# Patient Record
Sex: Male | Born: 1965 | Race: White | Hispanic: No | Marital: Married
Health system: Southern US, Community
[De-identification: ages and names within clinical notes are randomized; demographics above are authoritative.]

---

## 2016-11-28 ENCOUNTER — Other Ambulatory Visit: Payer: Self-pay | Admitting: Neurosurgery

## 2016-11-28 DIAGNOSIS — I6509 Occlusion and stenosis of unspecified vertebral artery: Secondary | ICD-10-CM

## 2016-12-27 ENCOUNTER — Other Ambulatory Visit: Payer: Self-pay | Admitting: Neurosurgery

## 2016-12-27 ENCOUNTER — Ambulatory Visit (HOSPITAL_COMMUNITY)
Admission: RE | Admit: 2016-12-27 | Discharge: 2016-12-27 | Disposition: A | Discharge status: Home / Self Care | Payer: BLUE CROSS/BLUE SHIELD | Source: Ambulatory Visit | Attending: Neurosurgery | Admitting: Neurosurgery

## 2016-12-27 ENCOUNTER — Encounter (HOSPITAL_COMMUNITY): Payer: Self-pay | Admitting: Neurosurgery

## 2016-12-27 DIAGNOSIS — I6509 Occlusion and stenosis of unspecified vertebral artery: Secondary | ICD-10-CM

## 2016-12-27 DIAGNOSIS — R42 Dizziness and giddiness: Secondary | ICD-10-CM | POA: Diagnosis present

## 2016-12-27 DIAGNOSIS — M545 Low back pain: Secondary | ICD-10-CM | POA: Diagnosis not present

## 2016-12-27 DIAGNOSIS — R55 Syncope and collapse: Secondary | ICD-10-CM | POA: Diagnosis not present

## 2016-12-27 DIAGNOSIS — G8929 Other chronic pain: Secondary | ICD-10-CM | POA: Diagnosis not present

## 2016-12-27 HISTORY — PX: IR ANGIO INTRA EXTRACRAN SEL INTERNAL CAROTID BILAT MOD SED: IMG5363

## 2016-12-27 HISTORY — PX: IR ANGIO VERTEBRAL SEL VERTEBRAL UNI L MOD SED: IMG5367

## 2016-12-27 LAB — CBC WITH DIFFERENTIAL/PLATELET
BASOS PCT: 0 %
Basophils Absolute: 0 10*3/uL (ref 0.0–0.1)
EOS ABS: 0.1 10*3/uL (ref 0.0–0.7)
Eosinophils Relative: 2 %
HCT: 42.6 % (ref 39.0–52.0)
HEMOGLOBIN: 14.5 g/dL (ref 13.0–17.0)
Lymphocytes Relative: 24 %
Lymphs Abs: 1.7 10*3/uL (ref 0.7–4.0)
MCH: 29.2 pg (ref 26.0–34.0)
MCHC: 34 g/dL (ref 30.0–36.0)
MCV: 85.7 fL (ref 78.0–100.0)
MONOS PCT: 7 %
Monocytes Absolute: 0.5 10*3/uL (ref 0.1–1.0)
NEUTROS PCT: 67 %
Neutro Abs: 4.9 10*3/uL (ref 1.7–7.7)
Platelets: 222 10*3/uL (ref 150–400)
RBC: 4.97 MIL/uL (ref 4.22–5.81)
RDW: 12.8 % (ref 11.5–15.5)
WBC: 7.2 10*3/uL (ref 4.0–10.5)

## 2016-12-27 LAB — BASIC METABOLIC PANEL
Anion gap: 6 (ref 5–15)
BUN: 14 mg/dL (ref 6–20)
CALCIUM: 8.8 mg/dL — AB (ref 8.9–10.3)
CO2: 25 mmol/L (ref 22–32)
CREATININE: 0.83 mg/dL (ref 0.61–1.24)
Chloride: 106 mmol/L (ref 101–111)
GFR calc non Af Amer: >60 mL/min (ref >60)
Glucose, Bld: 108 mg/dL — ABNORMAL HIGH (ref 65–99)
Potassium: 4.7 mmol/L (ref 3.5–5.1)
SODIUM: 137 mmol/L (ref 135–145)

## 2016-12-27 LAB — APTT: APTT: 30 s (ref 24–36)

## 2016-12-27 LAB — PROTIME-INR
INR: 1
PROTHROMBIN TIME: 13.2 s (ref 11.4–15.2)

## 2016-12-27 MED ORDER — FENTANYL CITRATE (PF) 100 MCG/2ML IJ SOLN
INTRAMUSCULAR | Status: AC
  Filled 2016-12-27: qty 2

## 2016-12-27 MED ORDER — MIDAZOLAM HCL 2 MG/2ML IJ SOLN
INTRAMUSCULAR | Status: AC | PRN
  Administered 2016-12-27: 1 mg via INTRAVENOUS

## 2016-12-27 MED ORDER — SODIUM CHLORIDE 0.9 % IV SOLN: INTRAVENOUS | Status: DC

## 2016-12-27 MED ORDER — IOPAMIDOL (ISOVUE-300) INJECTION 61%
INTRAVENOUS | Status: AC
  Administered 2016-12-27: 40 mL
  Filled 2016-12-27: qty 150

## 2016-12-27 MED ORDER — LIDOCAINE HCL (PF) 1 % IJ SOLN
INTRAMUSCULAR | Status: AC
  Filled 2016-12-27: qty 30

## 2016-12-27 MED ORDER — HEPARIN SODIUM (PORCINE) 1000 UNIT/ML IJ SOLN
INTRAMUSCULAR | Status: AC
  Filled 2016-12-27: qty 2

## 2016-12-27 MED ORDER — MIDAZOLAM HCL 2 MG/2ML IJ SOLN
INTRAMUSCULAR | Status: AC
  Filled 2016-12-27: qty 2

## 2016-12-27 MED ORDER — CEFAZOLIN SODIUM-DEXTROSE 2-4 GM/100ML-% IV SOLN
2.0000 g | INTRAVENOUS | Status: AC
  Administered 2016-12-27: 2 g via INTRAVENOUS

## 2016-12-27 MED ORDER — SODIUM CHLORIDE 0.9 % IV SOLN
INTRAVENOUS | Status: AC | PRN
  Administered 2016-12-27: 50 mL/h via INTRAVENOUS

## 2016-12-27 MED ORDER — FENTANYL CITRATE (PF) 100 MCG/2ML IJ SOLN
INTRAMUSCULAR | Status: AC | PRN
  Administered 2016-12-27: 25 ug via INTRAVENOUS

## 2016-12-27 MED ORDER — LIDOCAINE HCL (PF) 1 % IJ SOLN
INTRAMUSCULAR | Status: AC | PRN
  Administered 2016-12-27: 10 mL

## 2016-12-27 MED ORDER — HYDROCODONE-ACETAMINOPHEN 5-325 MG PO TABS: 1.0000 | ORAL_TABLET | ORAL | Status: DC | PRN

## 2016-12-27 MED ORDER — HEPARIN SODIUM (PORCINE) 1000 UNIT/ML IJ SOLN
INTRAMUSCULAR | Status: AC | PRN
  Administered 2016-12-27: 2000 [IU] via INTRAVENOUS

## 2016-12-27 NOTE — Sedation Documentation (Signed)
Patient is resting comfortably. 

## 2016-12-27 NOTE — Discharge Instructions (Addendum)
Femoral Site Care °Refer to this sheet in the next few weeks. These instructions provide you with information about caring for yourself after your procedure. Your health care provider may also give you more specific instructions. Your treatment has been planned according to current medical practices, but problems sometimes occur. Call your health care provider if you have any problems or questions after your procedure. °What can I expect after the procedure? °After your procedure, it is typical to have the following: °· Bruising at the site that usually fades within 1-2 weeks. °· Blood collecting in the tissue (hematoma) that may be painful to the touch. It should usually decrease in size and tenderness within 1-2 weeks. ° °Follow these instructions at home: °· Take medicines only as directed by your health care provider. °· You may shower 24-48 hours after the procedure or as directed by your health care provider. Remove the bandage (dressing) and gently wash the site with plain soap and water. Pat the area dry with a clean towel. Do not rub the site, because this may cause bleeding. °· Do not take baths, swim, or use a hot tub until your health care provider approves. °· Check your insertion site every day for redness, swelling, or drainage. °· Do not apply powder or lotion to the site. °· Limit use of stairs to twice a day for the first 2-3 days or as directed by your health care provider. °· Do not squat for the first 2-3 days or as directed by your health care provider. °· Do not lift over 10 lb (4.5 kg) for 5 days after your procedure or as directed by your health care provider. °· Ask your health care provider when it is okay to: °? Return to work or school. °? Resume usual physical activities or sports. °? Resume sexual activity. °· Do not drive home if you are discharged the same day as the procedure. Have someone else drive you. °· You may drive 24 hours after the procedure unless otherwise instructed by  your health care provider. °· Do not operate machinery or power tools for 24 hours after the procedure or as directed by your health care provider. °· If your procedure was done as an outpatient procedure, which means that you went home the same day as your procedure, a responsible adult should be with you for the first 24 hours after you arrive home. °· Keep all follow-up visits as directed by your health care provider. This is important. °Contact a health care provider if: °· You have a fever. °· You have chills. °· You have increased bleeding from the site. Hold pressure on the site. °Get help right away if: °· You have unusual pain at the site. °· You have redness, warmth, or swelling at the site. °· You have drainage (other than a small amount of blood on the dressing) from the site. °· The site is bleeding, and the bleeding does not stop after 30 minutes of holding steady pressure on the site. °· Your leg or foot becomes pale, cool, tingly, or numb. °This information is not intended to replace advice given to you by your health care provider. Make sure you discuss any questions you have with your health care provider. °Document Released: 12/25/2013 Document Revised: 09/29/2015 Document Reviewed: 11/10/2013 °Elsevier Interactive Patient Education © 2018 [employer] °Moderate Conscious Sedation, Adult, Care After °These instructions provide you with information about caring for yourself after your procedure. Your health care provider may also give you more   specific instructions. Your treatment has been planned according to current medical practices, but problems sometimes occur. Call your health care provider if you have any problems or questions after your procedure. What can I expect after the procedure? After your procedure, it is common:  To feel sleepy for several hours.  To feel clumsy and have poor balance for several hours.  To have poor judgment for several hours.  To vomit if you eat too  soon.  Follow these instructions at home: For at least 24 hours after the procedure:   Do not: ? Participate in activities where you could fall or become injured. ? Drive. ? Use heavy machinery. ? Drink alcohol. ? Take sleeping pills or medicines that cause drowsiness. ? Make important decisions or sign legal documents. ? Take care of children on your own.  Rest. Eating and drinking  Follow the diet recommended by your health care provider.  If you vomit: ? Drink water, juice, or soup when you can drink without vomiting. ? Make sure you have little or no nausea before eating solid foods. General instructions  Have a responsible adult stay with you until you are awake and alert.  Take over-the-counter and prescription medicines only as told by your health care provider.  If you smoke, do not smoke without supervision.  Keep all follow-up visits as told by your health care provider. This is important. Contact a health care provider if:  You keep feeling nauseous or you keep vomiting.  You feel light-headed.  You develop a rash.  You have a fever. Get help right away if:  You have trouble breathing. This information is not intended to replace advice given to you by your health care provider. Make sure you discuss any questions you have with your health care provider. Document Released: 02/11/2013 Document Revised: 09/26/2015 Document Reviewed: 08/13/2015 Elsevier Interactive Patient Education  2018 [employer] Cerebral Angiogram, Care After Refer to this sheet in the next few weeks. These instructions provide you with information on caring for yourself after your procedure. Your health care provider may also give you more specific instructions. Your treatment has been planned according to current medical practices, but problems sometimes occur. Call your health care provider if you have any problems or questions after your procedure. What can I expect after the  procedure? After your procedure, it is typical to have the following:  Bruising at the catheter insertion site that usually fades within 1-2 weeks.  Blood collecting in the tissue (hematoma) that may be painful to the touch. It should usually decrease in size and tenderness within 1-2 weeks.  A mild headache.  Follow these instructions at home:  Take medicines only as directed by your health care provider.  You may shower 24-48 hours after the procedure or as directed by your health care provider. Remove the bandage (dressing) and gently wash the site with plain soap and water. Pat the area dry with a clean towel. Do not rub the site, because this may cause bleeding.  Do not take baths, swim, or use a hot tub until your health care provider approves.  Check your insertion site every day for redness, swelling, or drainage.  Do not apply powder or lotion to the site.  Do not lift over 10 lb (4.5 kg) for 5 days after your procedure or as directed by your health care provider.  Ask your health care provider when it is okay to: ? Return to work or school. ? Resume usual physical  activities or sports. ? Resume sexual activity.  Do not drive home if you are discharged the same day as the procedure. Have someone else drive you.  You may drive 24 hours after the procedure unless otherwise instructed by your health care provider.  Do not operate machinery or power tools for 24 hours after the procedure or as directed by your health care provider.  If your procedure was done as an outpatient procedure, which means that you went home the same day as your procedure, a responsible adult should be with you for the first 24 hours after you arrive home.  Keep all follow-up visits as directed by your health care provider. This is important. Contact a health care provider if:  You have a fever.  You have chills.  You have increased bleeding from the catheter insertion site. Hold pressure on  the site. Get help right away if:  You have vision changes or loss of vision.  You have numbness or weakness on one side of your body.  You have difficulty talking, or you have slurred speech or cannot speak (aphasia).  You feel confused or have difficulty remembering.  You have unusual pain at the catheter insertion site.  You have redness, warmth, or swelling at the catheter insertion site.  You have drainage (other than a small amount of blood on the dressing) from the catheter insertion site.  The catheter insertion site is bleeding, and the bleeding does not stop after 30 minutes of holding steady pressure on the site. These symptoms may represent a serious problem that is an emergency. Do not wait to see if the symptoms will go away. Get medical help right away. Call your local emergency services (911 in [place] Do not drive yourself to the hospital. This information is not intended to replace advice given to you by your health care provider. Make sure you discuss any questions you have with your health care provider. Document Released: 09/07/2013 Document Revised: 09/29/2015 Document Reviewed: 05/06/2013 Elsevier Interactive Patient Education  2017 [employer].

## 2016-12-27 NOTE — H&P (Signed)
Chief Complaint  Dizziness, passes out when flat  History of Present Illness  Troy Small is a 51 y.o. male seen by my partner Dr. Franky Macho for chronic back pain. He also, however, c/o dizziness and syncope when lying flat. There was concern for vertebral artery stenosis and he was therefore referred for angiogram.  Past Medical History  No past medical history on file.  Past Surgical History  No past surgical history on file.  Social History   Social History  Substance Use Topics  . Smoking status: Not on file  . Smokeless tobacco: Not on file  . Alcohol use Not on file    Medications   Prior to Admission medications   Medication Sig Start Date End Date Taking? Authorizing Provider  amLODipine-valsartan (EXFORGE) 10-320 MG tablet Take 1 tablet by mouth daily.   Yes [provider]  ibuprofen (ADVIL,MOTRIN) 800 MG tablet Take 800 mg by mouth every 8 (eight) hours as needed for headache or moderate pain.   Yes [provider]    Allergies  No Known Allergies  Review of Systems  ROS  Neurologic Exam  Awake, alert, oriented Memory and concentration grossly intact Speech fluent, appropriate CN grossly intact Motor exam: Upper Extremities Deltoid Bicep Tricep Grip  Right 5/5 5/5 5/5 5/5  Left 5/5 5/5 5/5 5/5   Lower Extremities IP Quad PF DF EHL  Right 5/5 5/5 5/5 5/5 5/5  Left 5/5 5/5 5/5 5/5 5/5   Sensation grossly intact to LT  Impression  - 51 y.o. male with positional dizziness/syncope and concern for vertebral artery stenosis  Plan  - Proceed with diagnostic angiogram  I have reviewed the indications, risks, benefits, and alternatives to the angiogram with the patient and his wife. Specifically, we discussed risks of stroke, hemorrhage, arterial injury, contrast reaction, nephropathy, and groin hematoma. All questions were answered and they provided informed consent to proceed.

## 2016-12-27 NOTE — Progress Notes (Signed)
Pt stood at side of bed without dizziness, lightheadedness. Ambulated to bathroom and groin unremarkable when returned to bed. Instructions given to pt and wife with stated understanding. Ready for discharge.

## 2018-12-18 IMAGING — XA IR VERTEBRAL  SELECTIVE  UNILAT LEFT (MS)
9 of 10 series · 12 of 24 positions shown · IV contrast (IODINE)
Comparison: none

PROCEDURE:
DIAGNOSTIC CEREBRAL ANGIOGRAM
HISTORY: The patient is a 51-year-old man initially seen in the Outpatient
neurosurgery clinic with low back pain, as well as complaint of
dizziness and syncope when laying flat or with his neck in
extension. There was concern for positional vertebral artery
stenosis, and diagnostic cerebral angiogram was therefore requested.
TECHNIQUE: CATHETERS AND WIRES
5-French JB-1 catheter

[Series 1: carotid care 2 · 2 acquisitions, 2 frames shown (1 of 7)]
[im 1/2]
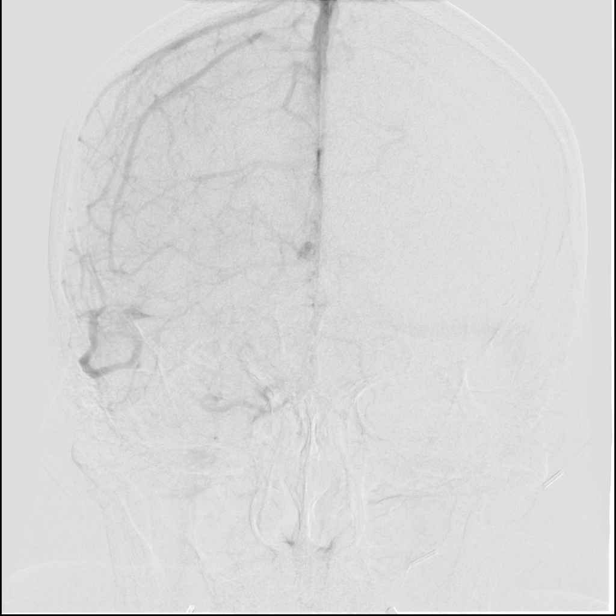
[im 2/2]
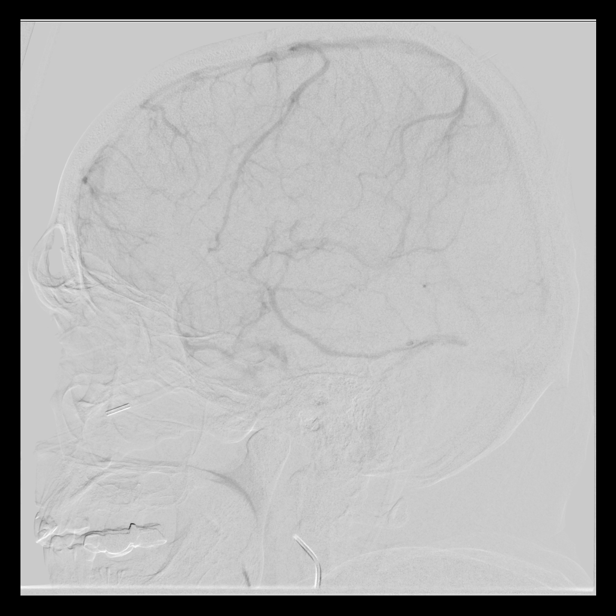

[Series 2: carotid care 2 · 3 acquisitions, 1 frame shown (2 of 7)]
[im 3/3  full-range]
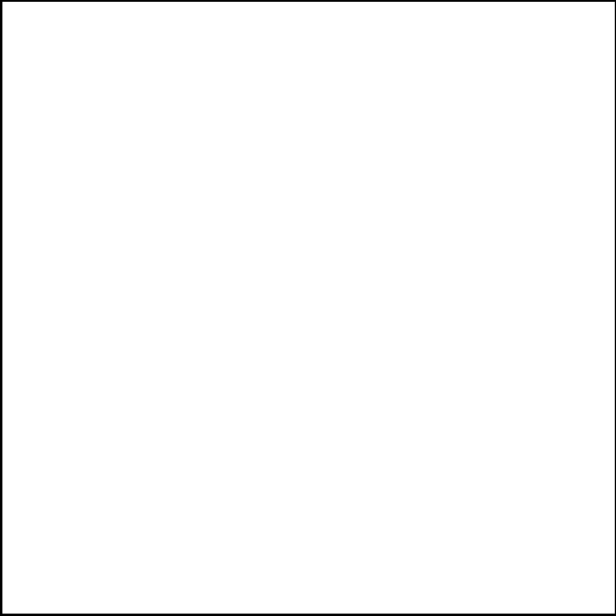

[Series 3: carotid care 2 · 2 acquisitions, 1 frame shown (3 of 7)]
[im 1/2  full-range]
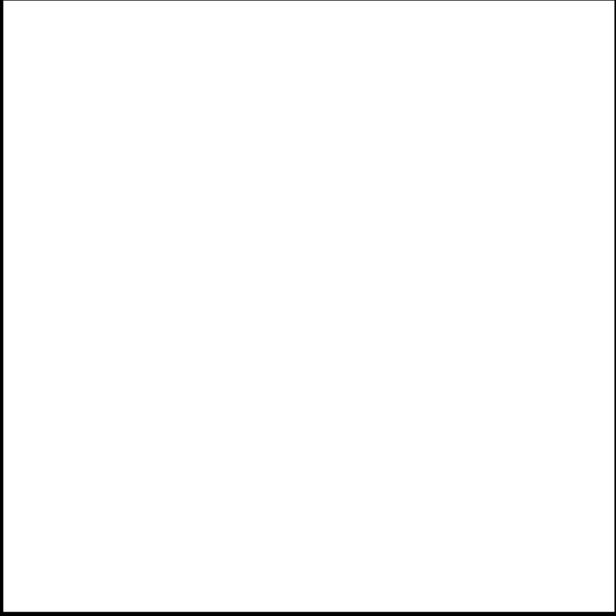

[Series 4: carotid care 2 · 2 acquisitions, 1 frame shown (4 of 7)]
[im 1/2]
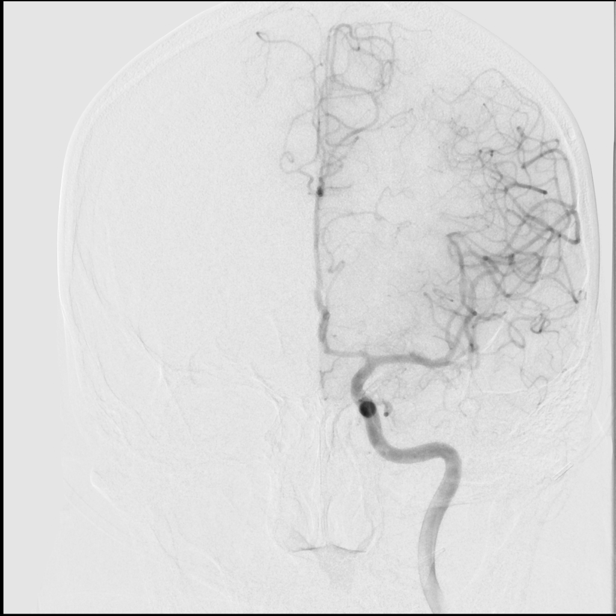

[Series 5: carotid care 2 · 2 acquisitions, 1 frame shown (5 of 7)]
[im 1/2]
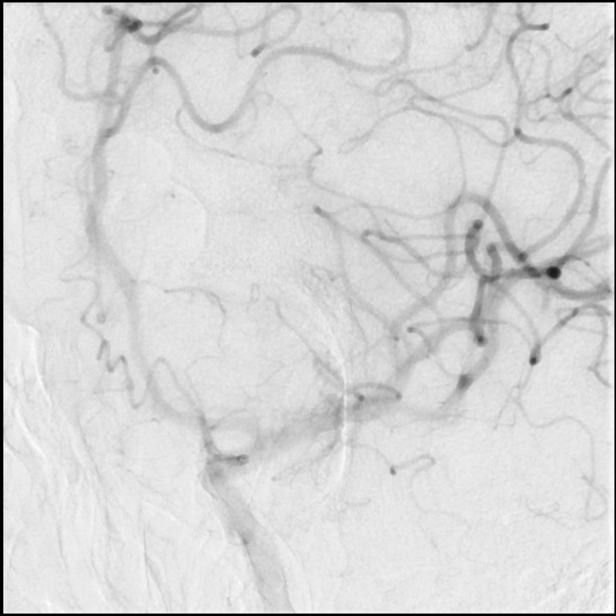

[Series 7: carotid care 2 · 2 acquisitions, 1 frame shown (6 of 7)]
[im 1/2]
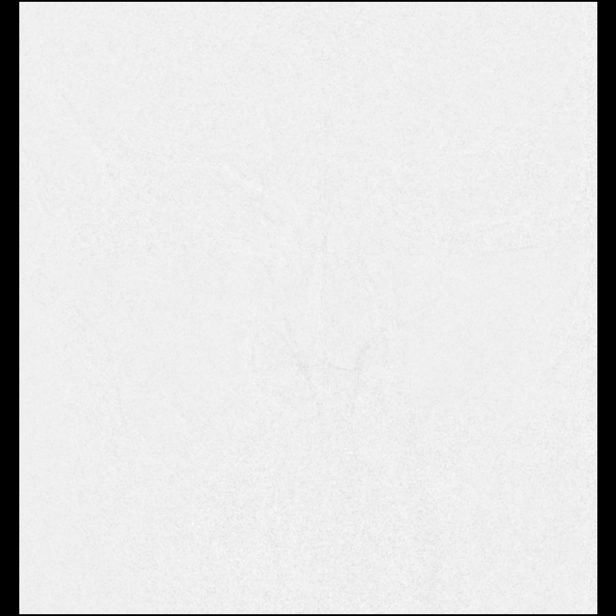

[Series 8: carotid care 2 · 2 acquisitions, 1 frame shown (7 of 7)]
[im 1/2]
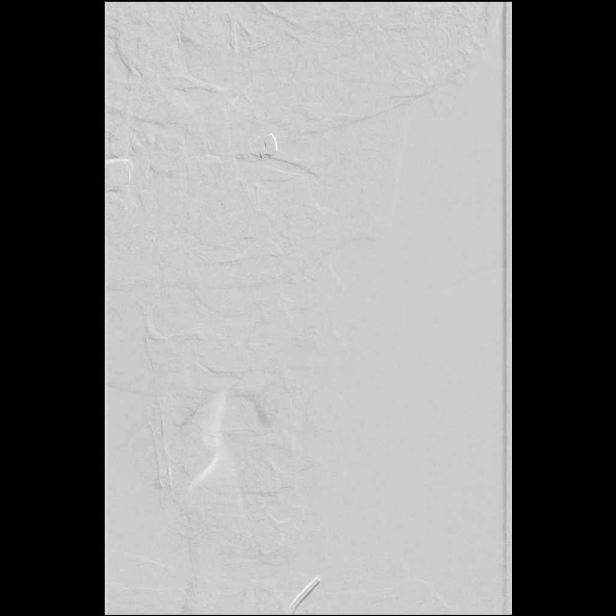

[Series 9: fl neuro n · 1 of 28 frames shown]
[frame 15/28]
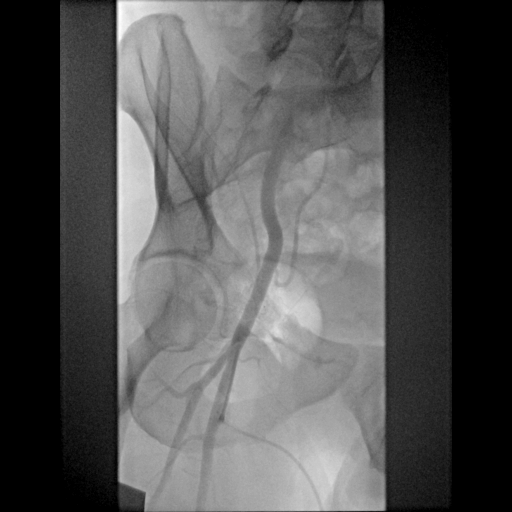

[Series 300: dr. (person_name) · 3 of 19 slices shown]
[im 4/19]
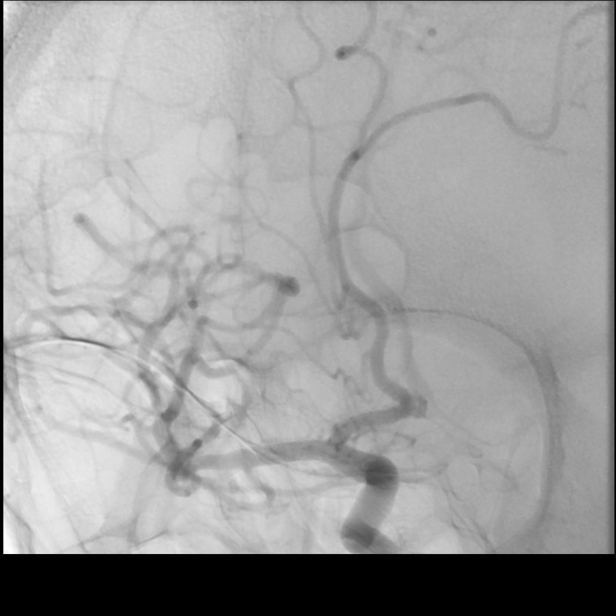
[im 10/19]
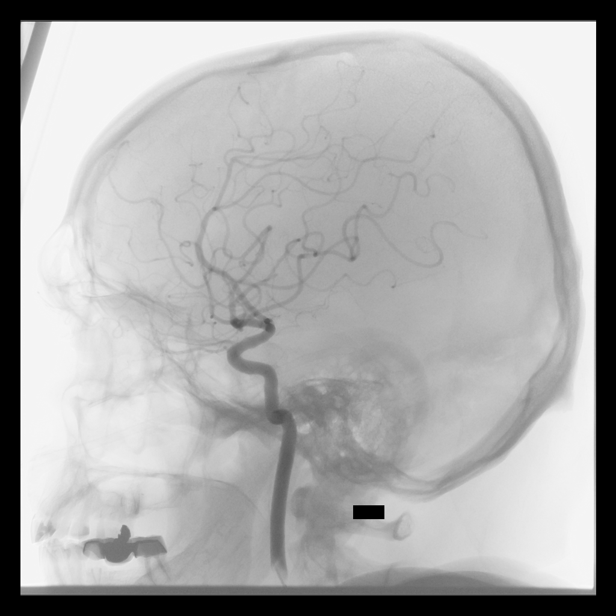
[im 19/19]
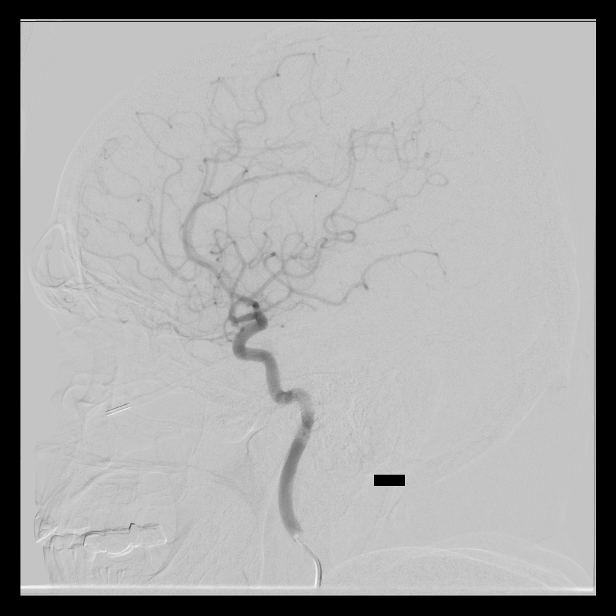

[12 of 24 positions shown; findings below may reference images not displayed]

ACCESS:
The technical aspects of the procedure as well as its potential
risks and benefits were reviewed with the patient. These risks
included but were not limited bleeding, infection, allergic
reaction, damage to organs or vital structures, stroke,
non-diagnostic procedure, and the catastrophic outcomes of heart
attack, coma, and death. With an understanding of these risks,
informed consent was obtained and witnessed. The patient was placed
in the supine position on the angiography table and the skin of
right groin prepped in the usual sterile fashion. The procedure was
performed under local anesthesia (1%-solution of
bicarbonate-buffered Lidocaine) and conscious sedation with Versed
and fentanyl monitored by the in-suite nurse. A 5- French sheath was
introduced in the right common femoral artery using Seldinger
technique. A fluoro-phase sequence was used to document the sheath
position.

MEDICATIONS:
HEPARIN: 8222 units total.

CONTRAST:  cc, Omnipaque 300

FLUOROSCOPY TIME:  FLUOROSCOPY TIME:  See IR records
0.035" glidewire

VESSELS CATHETERIZED
Right internal carotid

Left internal carotid

Left vertebral

Right common femoral

VESSELS STUDIED
Right internal carotid, head

Left internal carotid, head

Left vertebral, neck neutral position

Left vertebral, head neutral position

Left vertebral, neck extension/flat position

Right femoral

PROCEDURAL NARRATIVE
A 5-Fr JB-1 catheter was advanced over a 0.035 glidewire into the
aortic arch. The above vessels were then sequentially catheterized
and cervical / cerebral angiograms taken. After review of images,
the catheter was removed without incident.
FINDINGS: Right internal carotid: head:

Injection reveals the presence of a widely patent ICA, M1, and A1
segments and their branches. No AVMs, or high-flow fistulas are
seen. There is no carotid stenosis. There is suggestion of a small,
smooth appearing aneurysm at the right middle cerebral artery
bifurcation measuring 2.2 x 1.3 millimeters. The parenchymal and
venous phases are normal. The venous sinuses are widely patent.

Left internal carotid: head:

Injection reveals the presence of a widely patent ICA, A1, and M1
segments and their branches. No aneurysms, AVMs, or high-flow
fistulas are seen. The parenchymal and venous phases are normal. The
venous sinuses are widely patent.

Left vertebral, neck (neutral position):

The cervical portion of the left vertebral artery is unremarkable,
without any evidence of cervical vertebral stenosis.

Left vertebral, head:

Injection reveals the presence of a widely patent vertebral artery.
This leads to a widely patent basilar artery that terminates in
bilateral P1. The basilar apex is normal. There is no intracranial
vertebral artery stenosis. No aneurysms, AVMs, or fistulas are seen.
There is contrast reflux into the contralateral vertebral artery
which is noted to be diminutive. The contralateral PICA is also
visualized without any aneurysm seen. The parenchymal and venous
phases are normal. The venous sinuses are widely patent.

Left vertebral, neck:  (Extension/flat position)

The cervical vertebral artery remains widely patent, without any
evidence of positional stenosis or occlusion.

Right femoral:

Normal vessel. No significant atherosclerotic disease. Arterial
sheath in adequate position.

DISPOSITION:
Upon completion of the study, the femoral sheath was removed and
hemostasis obtained using a 5-Fr ExoSeal closure device. Good
proximal and distal lower extremity pulses were documented upon
achievement of hemostasis. The procedure was well tolerated and no
early complications were observed. The patient was transferred to
the holding area to lay flat for 2 hours.
IMPRESSION: 1. Dominant left vertebral artery, without evidence of positional
stenosis or occlusion to explain the patient's symptoms.

2. Incidental small aneurysm noted at the right middle cerebral
artery bifurcation as described above.

The preliminary results of this procedure were shared with the
patient and the patient's family.
